# Patient Record
Sex: Male | Born: 1975 | Race: White | Hispanic: No | Marital: Single | Smoking: Never smoker
Health system: Southern US, Community
[De-identification: ages and names within clinical notes are randomized; demographics above are authoritative.]

---

## 2007-05-18 ENCOUNTER — Emergency Department: Payer: Self-pay | Admitting: Emergency Medicine

## 2015-07-18 ENCOUNTER — Emergency Department
Admission: EM | Admit: 2015-07-18 | Discharge: 2015-07-18 | Disposition: A | Discharge status: Home / Self Care | Payer: Self-pay | Attending: Emergency Medicine | Admitting: Emergency Medicine

## 2015-07-18 ENCOUNTER — Encounter: Payer: Self-pay | Admitting: *Deleted

## 2015-07-18 DIAGNOSIS — W25XXXA Contact with sharp glass, initial encounter: Secondary | ICD-10-CM | POA: Insufficient documentation

## 2015-07-18 DIAGNOSIS — S61411A Laceration without foreign body of right hand, initial encounter: Secondary | ICD-10-CM | POA: Insufficient documentation

## 2015-07-18 DIAGNOSIS — Z23 Encounter for immunization: Secondary | ICD-10-CM | POA: Insufficient documentation

## 2015-07-18 DIAGNOSIS — Y9389 Activity, other specified: Secondary | ICD-10-CM | POA: Insufficient documentation

## 2015-07-18 DIAGNOSIS — Y929 Unspecified place or not applicable: Secondary | ICD-10-CM | POA: Insufficient documentation

## 2015-07-18 DIAGNOSIS — Y999 Unspecified external cause status: Secondary | ICD-10-CM | POA: Insufficient documentation

## 2015-07-18 MED ORDER — LIDOCAINE HCL (PF) 1 % IJ SOLN
5.0000 mL | Freq: Once | INTRAMUSCULAR | Status: AC
Start: 2015-07-18 — End: 2015-07-18
  Administered 2015-07-18: 5 mL
  Filled 2015-07-18: qty 5

## 2015-07-18 MED ORDER — TETANUS-DIPHTH-ACELL PERTUSSIS 5-2.5-18.5 LF-MCG/0.5 IM SUSP
0.5000 mL | Freq: Once | INTRAMUSCULAR | Status: AC
  Administered 2015-07-18: 0.5 mL via INTRAMUSCULAR
  Filled 2015-07-18: qty 0.5

## 2015-07-18 NOTE — ED Notes (Signed)
Pt has a laceration to right hand.  Cut hand on drinking glass.  Bleeding controlled.

## 2015-07-18 NOTE — Discharge Instructions (Signed)
Laceration Care, Adult  A laceration is a cut that goes through all layers of the skin. The cut also goes into the tissue that is right under the skin. Some cuts heal on their own. Others need to be closed with stitches (sutures), staples, skin adhesive strips, or wound glue. Taking care of your cut lowers your risk of infection and helps your cut to heal better.  HOW TO TAKE CARE OF YOUR CUT  For stitches or staples:  · Keep the wound clean and dry.  · If you were given a bandage (dressing), you should change it at least one time per day or as told by your doctor. You should also change it if it gets wet or dirty.  · Keep the wound completely dry for the first 24 hours or as told by your doctor. After that time, you may take a shower or a bath. However, make sure that the wound is not soaked in water until after the stitches or staples have been removed.  · Clean the wound one time each day or as told by your doctor:    Wash the wound with soap and water.    Rinse the wound with water until all of the soap comes off.    Pat the wound dry with a clean towel. Do not rub the wound.  · After you clean the wound, put a thin layer of antibiotic ointment on it as told by your doctor. This ointment:    Helps to prevent infection.    Keeps the bandage from sticking to the wound.  · Have your stitches or staples removed as told by your doctor.  If your doctor used skin adhesive strips:   · Keep the wound clean and dry.  · If you were given a bandage, you should change it at least one time per day or as told by your doctor. You should also change it if it gets dirty or wet.  · Do not get the skin adhesive strips wet. You can take a shower or a bath, but be careful to keep the wound dry.  · If the wound gets wet, pat it dry with a clean towel. Do not rub the wound.  · Skin adhesive strips fall off on their own. You can trim the strips as the wound heals. Do not remove any strips that are still stuck to the wound. They will  fall off after a while.  If your doctor used wound glue:  · Try to keep your wound dry, but you may briefly wet it in the shower or bath. Do not soak the wound in water, such as by swimming.  · After you take a shower or a bath, gently pat the wound dry with a clean towel. Do not rub the wound.  · Do not do any activities that will make you really sweaty until the skin glue has fallen off on its own.  · Do not apply liquid, cream, or ointment medicine to your wound while the skin glue is still on.  · If you were given a bandage, you should change it at least one time per day or as told by your doctor. You should also change it if it gets dirty or wet.  · If a bandage is placed over the wound, do not let the tape for the bandage touch the skin glue.  · Do not pick at the glue. The skin glue usually stays on for 5-10 days. Then, it   or when wound glue stays in place and the wound is healed. Make sure to wear a sunscreen of at least 30 SPF.  Take over-the-counter and prescription medicines only as told by your doctor.  If you were given antibiotic medicine or ointment, take or apply it as told by your doctor. Do not stop using the antibiotic even if your wound is getting better.  Do not scratch or pick at the wound.  Keep all follow-up visits as told by your doctor. This is important.  Check your wound every day for signs of infection. Watch for:  Redness, swelling, or pain.  Fluid, blood, or pus.  Raise (elevate) the injured area above the level of your heart while you are sitting or lying down, if possible. GET HELP IF:  You got a tetanus shot and you have any of these problems at the injection site:  Swelling.  Very bad pain.  Redness.  Bleeding.  You have a fever.  A wound that was  closed breaks open.  You notice a bad smell coming from your wound or your bandage.  You notice something coming out of the wound, such as wood or glass.  Medicine does not help your pain.  You have more redness, swelling, or pain at the site of your wound.  You have fluid, blood, or pus coming from your wound.  You notice a change in the color of your skin near your wound.  You need to change the bandage often because fluid, blood, or pus is coming from the wound.  You start to have a new rash.  You start to have numbness around the wound. GET HELP RIGHT AWAY IF:  You have very bad swelling around the wound.  Your pain suddenly gets worse and is very bad.  You notice painful lumps near the wound or on skin that is anywhere on your body.  You have a red streak going away from your wound.  The wound is on your hand or foot and you cannot move a finger or toe like you usually can.  The wound is on your hand or foot and you notice that your fingers or toes look pale or bluish.   This information is not intended to replace advice given to you by your health care provider. Make sure you discuss any questions you have with your health care provider.   Document Released: 09/01/2007 Document Revised: 07/30/2014 Document Reviewed: 03/11/2014 Elsevier Interactive Patient Education [employer]2016 [employer]  Keep the wound clean, dry, and covered. Follow-up with the local urgent care for suture removal.

## 2015-07-18 NOTE — ED Notes (Signed)
Pt in via triage with complaints of laceration to right hand; pt reports washing a wine glass when the stem broke and cut the palm of his hand.  Pt reports being seen at urgent care and being sent here due to depth of laceration.  Right hand dressed in gauze; bleeding controlled at this time.  No signs of immediate distress.

## 2015-07-19 NOTE — ED Provider Notes (Signed)
[facility] Emergency Department Provider Note ____________________________________________  Time seen: 1903  I have reviewed the triage vital signs and the nursing notes.  HISTORY  Chief Complaint  Laceration  HPI Jeffery Kidd is a 40 y.o. male right-hand-dominant male reports to the ED for evaluation of laceration sustained to his right palm at [place] washing a wine glass. He describes the stem of the glass broke in his hand. He initially reported to urgent care but was deferred due to the ED secondary to the depth of the wound. He has a dressing applied to the palm of the hand bleeding is currently controlled. He denies any distal paresthesias, difficulty with flexion of the hand, or any other injury at this time. He reports his tetanus status is not current.He rates his discomfort a 4/10 in triage.  No past medical history on file.  There are no active problems to display for this patient.   No past surgical history on file.  No current outpatient prescriptions on file.  Allergies Review of patient's allergies indicates no known allergies.  No family history on file.  Social History Social History  Substance Use Topics  . Smoking status: Never Smoker   . Smokeless tobacco: None  . Alcohol Use: Yes    Review of Systems  Constitutional: Negative for fever. Cardiovascular: Negative for chest pain. Respiratory: Negative for shortness of breath. Gastrointestinal: Negative for abdominal pain, vomiting and diarrhea. Genitourinary: Negative for dysuria. Musculoskeletal: Negative for back pain.Right palm laceration as above. Skin: Negative for rash. Neurological: Negative for headaches, focal weakness or numbness. ____________________________________________  PHYSICAL EXAM:  VITAL SIGNS: ED Triage Vitals  Enc Vitals Group     BP 07/18/15 1814 136/60 mmHg     Pulse Rate 07/18/15 1814 81     Resp 07/18/15 1814 20     Temp 07/18/15 1814 99.4  F (37.4 C)     Temp Source 07/18/15 1814 Oral     SpO2 07/18/15 1814 99 %     Weight 07/18/15 1814 150 lb (68.04 kg)     Height 07/18/15 1814  (1.778 m)     Head Cir --      Peak Flow --      Pain Score 07/18/15 1815 4     Pain Loc --      Pain Edu? --      Excl. in GC? --    Constitutional: Alert and oriented. Well appearing and in no distress. Head: Normocephalic and atraumatic. Hematological/Lymphatic/Immunological: No cervical lymphadenopathy. Cardiovascular: Normal rate, regular rhythm. Normal distal pulses and normal capillary refill. Respiratory: Normal respiratory effort. No wheezes/rales/rhonchi. Gastrointestinal: Soft and nontender. No distention. Musculoskeletal: Right palm with normal composite fist and flexion range of the fingers. Nontender with normal range of motion in all extremities.  Neurologic: Normal gross sensation is intact. Cranial nerves II through XII grossly intact. Normal gait without ataxia. Normal speech and language. No gross focal neurologic deficits are appreciated. Skin:  Skin is warm, dry and intact. No rash noted. Patient with a 3 cm linear lac across the second and third MCPs of the palm. Psychiatric: Mood and affect are normal. Patient exhibits appropriate insight and judgment. ____________________________________________  PROCEDURES  Tdap 0.5 mg IM  LACERATION REPAIR Performed by: Lissa Hoard Authorized by: Lissa Hoard Consent: Verbal consent obtained. Risks and benefits: risks, benefits and alternatives were discussed Consent given by: patient Patient identity confirmed: provided demographic data Prepped and Draped in normal sterile  fashion Wound explored  Laceration Location: right palm  Laceration Length: 3 cm  No Foreign Bodies seen or palpated  Anesthesia: local infiltration  Local anesthetic: lidocaine 1% w/o epinephrine  Anesthetic total: 5 ml  Irrigation method: syringe Amount of  cleaning: standard  Skin closure: 4-0 nylon  Number of sutures: 8  Technique: simple interrupted  Patient tolerance: Patient tolerated the procedure well with no immediate complications. ____________________________________________  INITIAL IMPRESSION / ASSESSMENT AND PLAN / ED COURSE  Patient with a laceration to the palm of the right hand just over the first and second MCPs. There is no neuromuscular deficit on exam and no flexor tendon injury. Wounds are repaired with sutures and the patient is discharged with wound care management instructions be he should follow with his primary care provider with the local urgent cares for suture removal in 10-12 days.  ____________________________________________  FINAL CLINICAL IMPRESSION(S) / ED DIAGNOSES  Final diagnoses:  Hand laceration, right, initial encounter      Lissa HoardJenise V Bacon Janica Eldred, PA-C 07/19/15 0136  Phineas SemenGraydon Goodman, MD 07/19/15 1538

## 2017-02-08 ENCOUNTER — Emergency Department
Admission: EM | Admit: 2017-02-08 | Discharge: 2017-02-08 | Disposition: A | Discharge status: Home / Self Care | Payer: Self-pay | Attending: Emergency Medicine | Admitting: Emergency Medicine

## 2017-02-08 ENCOUNTER — Encounter: Payer: Self-pay | Admitting: *Deleted

## 2017-02-08 ENCOUNTER — Other Ambulatory Visit: Payer: Self-pay

## 2017-02-08 DIAGNOSIS — T148XXA Other injury of unspecified body region, initial encounter: Secondary | ICD-10-CM

## 2017-02-08 DIAGNOSIS — L03115 Cellulitis of right lower limb: Secondary | ICD-10-CM | POA: Insufficient documentation

## 2017-02-08 DIAGNOSIS — F424 Excoriation (skin-picking) disorder: Secondary | ICD-10-CM | POA: Insufficient documentation

## 2017-02-08 DIAGNOSIS — Z23 Encounter for immunization: Secondary | ICD-10-CM | POA: Insufficient documentation

## 2017-02-08 DIAGNOSIS — Z978 Presence of other specified devices: Secondary | ICD-10-CM | POA: Insufficient documentation

## 2017-02-08 DIAGNOSIS — M7918 Myalgia, other site: Secondary | ICD-10-CM | POA: Insufficient documentation

## 2017-02-08 DIAGNOSIS — R52 Pain, unspecified: Secondary | ICD-10-CM

## 2017-02-08 LAB — COMPREHENSIVE METABOLIC PANEL
ALT: 66 U/L — ABNORMAL HIGH (ref 17–63)
ANION GAP: 10 (ref 5–15)
AST: 38 U/L (ref 15–41)
Albumin: 4.1 g/dL (ref 3.5–5.0)
Alkaline Phosphatase: 185 U/L — ABNORMAL HIGH (ref 38–126)
BUN: 11 mg/dL (ref 6–20)
CHLORIDE: 101 mmol/L (ref 101–111)
CO2: 26 mmol/L (ref 22–32)
CREATININE: 0.76 mg/dL (ref 0.61–1.24)
Calcium: 8.8 mg/dL — ABNORMAL LOW (ref 8.9–10.3)
GFR calc Af Amer: >60 mL/min (ref >60)
Glucose, Bld: 111 mg/dL — ABNORMAL HIGH (ref 65–99)
POTASSIUM: 3.5 mmol/L (ref 3.5–5.1)
Sodium: 137 mmol/L (ref 135–145)
Total Bilirubin: 0.8 mg/dL (ref 0.3–1.2)
Total Protein: 8.2 g/dL — ABNORMAL HIGH (ref 6.5–8.1)

## 2017-02-08 LAB — CBC WITH DIFFERENTIAL/PLATELET
Basophils Absolute: 0 10*3/uL (ref 0–0.1)
Basophils Relative: 0 %
EOS ABS: 0.2 10*3/uL (ref 0–0.7)
Eosinophils Relative: 2 %
HCT: 38.2 % — ABNORMAL LOW (ref 40.0–52.0)
Hemoglobin: 13.1 g/dL (ref 13.0–18.0)
LYMPHS ABS: 1 10*3/uL (ref 1.0–3.6)
LYMPHS PCT: 11 %
MCH: 30.1 pg (ref 26.0–34.0)
MCHC: 34.2 g/dL (ref 32.0–36.0)
MCV: 88 fL (ref 80.0–100.0)
Monocytes Absolute: 0.6 10*3/uL (ref 0.2–1.0)
Monocytes Relative: 6 %
NEUTROS PCT: 81 %
Neutro Abs: 7.9 10*3/uL — ABNORMAL HIGH (ref 1.4–6.5)
Platelets: 305 10*3/uL (ref 150–440)
RBC: 4.35 MIL/uL — ABNORMAL LOW (ref 4.40–5.90)
RDW: 12.6 % (ref 11.5–14.5)
WBC: 9.8 10*3/uL (ref 3.8–10.6)

## 2017-02-08 MED ORDER — TETANUS-DIPHTH-ACELL PERTUSSIS 5-2.5-18.5 LF-MCG/0.5 IM SUSP
0.5000 mL | Freq: Once | INTRAMUSCULAR | Status: AC
  Administered 2017-02-08: 0.5 mL via INTRAMUSCULAR
  Filled 2017-02-08: qty 0.5

## 2017-02-08 MED ORDER — TRIPLE ANTIBIOTIC 5-400-5000 EX OINT: TOPICAL_OINTMENT | Freq: Three times a day (TID) | CUTANEOUS | 0 refills | Status: AC

## 2017-02-08 MED ORDER — IBUPROFEN 800 MG PO TABS
800.0000 mg | ORAL_TABLET | Freq: Once | ORAL | Status: AC
  Administered 2017-02-08: 800 mg via ORAL
  Filled 2017-02-08: qty 1

## 2017-02-08 MED ORDER — BACITRACIN ZINC 500 UNIT/GM EX OINT
TOPICAL_OINTMENT | Freq: Once | CUTANEOUS | Status: AC
  Administered 2017-02-08: 1 via TOPICAL
  Filled 2017-02-08: qty 0.9

## 2017-02-08 MED ORDER — SULFAMETHOXAZOLE-TRIMETHOPRIM 800-160 MG PO TABS: 1.0000 | ORAL_TABLET | Freq: Two times a day (BID) | ORAL | 0 refills | Status: DC

## 2017-02-08 MED ORDER — IBUPROFEN 800 MG PO TABS: 800.0000 mg | ORAL_TABLET | Freq: Three times a day (TID) | ORAL | 0 refills | Status: DC | PRN

## 2017-02-08 MED ORDER — SULFAMETHOXAZOLE-TRIMETHOPRIM 800-160 MG PO TABS
1.0000 | ORAL_TABLET | Freq: Once | ORAL | Status: AC
  Administered 2017-02-08: 1 via ORAL
  Filled 2017-02-08: qty 1

## 2017-02-08 NOTE — ED Notes (Signed)
Rash with skin discoloration to right lower extremity - near his ankle  - scratches present also  Pt reports that he has had it for months and he has tried everything but it will not go away

## 2017-02-08 NOTE — ED Provider Notes (Signed)
[facility] Emergency Department Provider Note  ____________________________________________  Time seen: Approximately 3:40 PM  I have reviewed the triage vital signs and the nursing notes.   HISTORY  Chief Complaint Rash    HPI Jeffery Kidd is a 41 y.o. male with a history of right tibial hardware placement for fracture remotely presenting with right lower extremity rash, and separately with upper body aches.  The patient reports that for the past month he has had excoriations, scabbing to the medial right lower extremity.  "May be I scratched it while I was sleeping."  He denies any new injury, systemic symptoms including fever, nausea or vomiting.  Over the past 2-3 days, the patient also reports that "every muscle in my body from my waist upwards is sore."  He denies any associated cough or cold symptoms, nausea vomiting or diarrhea, or trauma or new exercise regimen.  He has not tried anything for his pain.  History reviewed. No pertinent past medical history.  There are no active problems to display for this patient.   History reviewed. No pertinent surgical history.    Allergies Patient has no known allergies.  History reviewed. No pertinent family history.  Social History Social History   Tobacco Use  . Smoking status: Never Smoker  . Smokeless tobacco: Never Used  Substance Use Topics  . Alcohol use: Yes  . Drug use: Not on file    Review of Systems Constitutional: No fever/chills.  No lightheadedness or syncope. Eyes: No visual changes. ENT: No sore throat. No congestion or rhinorrhea. Cardiovascular: Denies chest pain. Denies palpitations. Respiratory: Denies shortness of breath.  No cough. Gastrointestinal: No abdominal pain.  No nausea, no vomiting.  No diarrhea.  No constipation. Genitourinary: Negative for dysuria. Musculoskeletal: Negative for back pain.  Positive for generalized body aches in the upper body. Skin:  Positive for rash. Neurological: Negative for headaches. No focal numbness, tingling or weakness.     ____________________________________________   PHYSICAL EXAM:  VITAL SIGNS: ED Triage Vitals  Enc Vitals Group     BP 02/08/17 1153 134/60     Pulse Rate 02/08/17 1153 85     Resp 02/08/17 1153 16     Temp 02/08/17 1153 99.9 F (37.7 C)     Temp Source 02/08/17 1153 Oral     SpO2 02/08/17 1153 99 %     Weight 02/08/17 1153 155 lb (70.3 kg)     Height 02/08/17 1153 5\' 10"  (1.778 m)     Head Circumference --      Peak Flow --      Pain Score 02/08/17 1452 8     Pain Loc --      Pain Edu? --      Excl. in GC? --     Constitutional: Alert and oriented. Well appearing and in no acute distress. Answers questions appropriately.  The patient is able to move about without any difficulty and ambulates without any problem. Eyes: Conjunctivae are normal.  EOMI. No scleral icterus. Head: Atraumatic. Nose: No congestion/rhinnorhea. Mouth/Throat: Mucous membranes are moist.  Neck: No stridor.  Supple.  No JVD.  No meningismus. Cardiovascular: Normal rate, regular rhythm. No murmurs, rubs or gallops.  Respiratory: Normal respiratory effort.  No accessory muscle use or retractions. Lungs CTAB.  No wheezes, rales or ronchi. Gastrointestinal: Soft, nontender and nondistended.  No guarding or rebound.  No peritoneal signs. Musculoskeletal: No lower extremity swelling.  The patient has full range of motion of the  bilateral wrists, elbows and shoulders without pain and no evidence of effusion.  He has no evidence of muscle contractions or any overlying skin abnormalities in the chest, back, bilateral upper extremities where he states he is experiencing severe body aches. Neurologic:  A&Ox3.  Speech is clear.  Face and smile are symmetric.  EOMI.  Moves all extremities well. Skin:  Skin is warm, dry.  The patient has an excoriated superficial abrasions of the right medial lower extremity most  prominent around the medial malleolus and moving upwards towards the knee.  The findings are consistent with scratches, and most are scabbed over.  There is some minimal erythema without warmth surrounding these areas.  There is no fluctuance or significant swelling.  The patient has normal DP and PT pulses on the right. Psychiatric: Mood and affect are normal. Speech and behavior are normal.  Normal judgement.  ____________________________________________   LABS (all labs ordered are listed, but only abnormal results are displayed)  Labs Reviewed  COMPREHENSIVE METABOLIC PANEL - Abnormal; Notable for the following components:      Result Value   Glucose, Bld 111 (*)    Calcium 8.8 (*)    Total Protein 8.2 (*)    ALT 66 (*)    Alkaline Phosphatase 185 (*)    All other components within normal limits  CBC WITH DIFFERENTIAL/PLATELET - Abnormal; Notable for the following components:   RBC 4.35 (*)    HCT 38.2 (*)    Neutro Abs 7.9 (*)    All other components within normal limits   ____________________________________________  EKG  Not indicated ____________________________________________  RADIOLOGY  No results found.  ____________________________________________   PROCEDURES  Procedure(s) performed: None  Procedures  Critical Care performed: No ____________________________________________   INITIAL IMPRESSION / ASSESSMENT AND PLAN / ED COURSE  Pertinent labs & imaging results that were available during my care of the patient were reviewed by me and considered in my medical decision making (see chart for details).  [age] y.o. male with prior right lower extremity hardware for fracture presenting with overlying rash that is most consistent with self scratching.  However, there is some mild erythema, so we will treat the patient with 10 days of antibiotics for overlying cellulitis.  There is no evidence of septic joint.  I have encouraged him to use a triple antibiotic  cream topically 3 times daily, and we will apply the first dose for him here.  We will also update his Tdap.  In terms of his generalized body aches, he has no associated symptoms so influenza is unlikely.  No recent strain or trauma, so rhabdomyolysis is very unlikely; the patient's creatinine is normal today and he does not look particularly dehydrated.  There is no associated weakness.  I have recommended that the patient take Motrin and drink plenty of fluid, and get reevaluated by his primary care physician when he goes back for wound check in 3-5 days.  At this time, the patient is safe for discharge.  He understands return precautions as well as follow-up instructions.  ____________________________________________  FINAL CLINICAL IMPRESSION(S) / ED DIAGNOSES  Final diagnoses:  Cellulitis of right lower extremity  Scratches self  Body aches         NEW MEDICATIONS STARTED DURING THIS VISIT:  This SmartLink is deprecated. Use AVSMEDLIST instead to display the medication list for a patient.    Rockne MenghiniNorman, Anne-Caroline, MD 02/08/17 1550

## 2017-02-08 NOTE — Discharge Instructions (Signed)
Please take all precautions not to scratch her leg anymore.  Take the entire course of antibiotics, even if your leg is feeling better.  Continue to apply Neosporin or any triple antibiotic cream and a thick coat 3 times daily until your wound has completely healed.  Drink plenty of fluids stay well-hydrated.  Return to the emergency department if you develop severe pain, fever, increasing redness, swelling or pus drainage from your wound, nausea or vomiting, or any other symptoms concerning to you.
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
## 2017-02-08 NOTE — ED Notes (Signed)
Right leg dressed with gauze and bacitracin ointment.  Pt alert. D/c in st to pt.

## 2017-02-23 ENCOUNTER — Other Ambulatory Visit: Payer: Self-pay

## 2017-02-23 ENCOUNTER — Encounter: Payer: Self-pay | Admitting: Emergency Medicine

## 2017-02-23 ENCOUNTER — Emergency Department
Admission: EM | Admit: 2017-02-23 | Discharge: 2017-02-23 | Disposition: A | Discharge status: Home / Self Care | Payer: Self-pay | Attending: Emergency Medicine | Admitting: Emergency Medicine

## 2017-02-23 ENCOUNTER — Emergency Department: Payer: Self-pay

## 2017-02-23 DIAGNOSIS — T370X5A Adverse effect of sulfonamides, initial encounter: Secondary | ICD-10-CM | POA: Insufficient documentation

## 2017-02-23 DIAGNOSIS — L270 Generalized skin eruption due to drugs and medicaments taken internally: Secondary | ICD-10-CM | POA: Insufficient documentation

## 2017-02-23 DIAGNOSIS — R21 Rash and other nonspecific skin eruption: Secondary | ICD-10-CM | POA: Insufficient documentation

## 2017-02-23 DIAGNOSIS — A938 Other specified arthropod-borne viral fevers: Secondary | ICD-10-CM | POA: Insufficient documentation

## 2017-02-23 LAB — URINALYSIS, COMPLETE (UACMP) WITH MICROSCOPIC
BILIRUBIN URINE: NEGATIVE
Bacteria, UA: NONE SEEN
Glucose, UA: NEGATIVE mg/dL
Ketones, ur: NEGATIVE mg/dL
Leukocytes, UA: NEGATIVE
NITRITE: NEGATIVE
PH: 5 (ref 5.0–8.0)
Protein, ur: NEGATIVE mg/dL
SPECIFIC GRAVITY, URINE: 1.009 (ref 1.005–1.030)

## 2017-02-23 LAB — COMPREHENSIVE METABOLIC PANEL
ALBUMIN: 4.1 g/dL (ref 3.5–5.0)
ALT: 42 U/L (ref 17–63)
ANION GAP: 12 (ref 5–15)
AST: 34 U/L (ref 15–41)
Alkaline Phosphatase: 149 U/L — ABNORMAL HIGH (ref 38–126)
BUN: 11 mg/dL (ref 6–20)
CHLORIDE: 98 mmol/L — AB (ref 101–111)
CO2: 27 mmol/L (ref 22–32)
Calcium: 8.9 mg/dL (ref 8.9–10.3)
Creatinine, Ser: 0.64 mg/dL (ref 0.61–1.24)
GFR calc Af Amer: >60 mL/min (ref >60)
GFR calc non Af Amer: >60 mL/min (ref >60)
GLUCOSE: 99 mg/dL (ref 65–99)
POTASSIUM: 3.8 mmol/L (ref 3.5–5.1)
SODIUM: 137 mmol/L (ref 135–145)
TOTAL PROTEIN: 7.8 g/dL (ref 6.5–8.1)
Total Bilirubin: 0.8 mg/dL (ref 0.3–1.2)

## 2017-02-23 LAB — CBC WITH DIFFERENTIAL/PLATELET
BASOS ABS: 0 10*3/uL (ref 0–0.1)
BASOS PCT: 0 %
EOS ABS: 0.6 10*3/uL (ref 0–0.7)
EOS PCT: 6 %
HCT: 39 % — ABNORMAL LOW (ref 40.0–52.0)
Hemoglobin: 13.4 g/dL (ref 13.0–18.0)
Lymphocytes Relative: 11 %
Lymphs Abs: 1.2 10*3/uL (ref 1.0–3.6)
MCH: 29.8 pg (ref 26.0–34.0)
MCHC: 34.4 g/dL (ref 32.0–36.0)
MCV: 86.7 fL (ref 80.0–100.0)
MONO ABS: 0.7 10*3/uL (ref 0.2–1.0)
MONOS PCT: 7 %
NEUTROS ABS: 7.7 10*3/uL — AB (ref 1.4–6.5)
Neutrophils Relative %: 76 %
PLATELETS: 328 10*3/uL (ref 150–440)
RBC: 4.5 MIL/uL (ref 4.40–5.90)
RDW: 12.6 % (ref 11.5–14.5)
WBC: 10.1 10*3/uL (ref 3.8–10.6)

## 2017-02-23 LAB — CHLAMYDIA/NGC RT PCR (ARMC ONLY)
CHLAMYDIA TR: NOT DETECTED
N GONORRHOEAE: NOT DETECTED

## 2017-02-23 LAB — POCT RAPID STREP A: Streptococcus, Group A Screen (Direct): NEGATIVE

## 2017-02-23 LAB — CK: Total CK: 44 U/L — ABNORMAL LOW (ref 49–397)

## 2017-02-23 MED ORDER — DOXYCYCLINE HYCLATE 100 MG PO CAPS: 100.0000 mg | ORAL_CAPSULE | Freq: Two times a day (BID) | ORAL | 0 refills | Status: AC

## 2017-02-23 MED ORDER — SODIUM CHLORIDE 0.9 % IV BOLUS (SEPSIS)
1000.0000 mL | Freq: Once | INTRAVENOUS | Status: AC
  Administered 2017-02-23: 1000 mL via INTRAVENOUS

## 2017-02-23 NOTE — Discharge Instructions (Signed)
Or up with your regular doctor or the acute care if you're not better in 3 days, list sulfa medications in your allergies, to not take your sulfa medication that was prescribed other day, due to the muscular pain and petechial type rash we have concerns of view having a tick born illness, you need to take the doxycycline until it is finished, turned to the emergency department if you're worsening

## 2017-02-23 NOTE — ED Provider Notes (Signed)
[facility] Emergency Department Provider Note  ____________________________________________   First MD Initiated Contact with Patient 02/23/17 1455     (approximate)  I have reviewed the triage vital signs and the nursing notes.   HISTORY  Chief Complaint Rash and Generalized Body Aches    HPI Jeffery Kidd is a 41 y.o. male complains of a rash all over his body, body aches states every muscle in his body hurts, he was seen here last week for the same rash on the lower leg, was given a prescription for Septra, states he broke out all over in these red bumps, states he still hurts all over, doesn't know if he had a fever but he felt warm, denies any injury, denies burning with urination, has had unprotected sex, as any discharge from penis, patient states he feels a little confused   History reviewed. No pertinent past medical history.  There are no active problems to display for this patient.   History reviewed. No pertinent surgical history.  Prior to Admission medications   Medication Sig Start Date End Date Taking? Authorizing Provider  doxycycline (VIBRAMYCIN) 100 MG capsule Take 1 capsule (100 mg total) by mouth 2 (two) times daily. 02/23/17   Zemirah Krasinski, Roselyn BeringSusan W, PA-C  neomycin-bacitracin-polymyxin (NEOSPORIN) [phone] ointment Apply 3 (three) times daily topically. 02/08/17   Rockne MenghiniNorman, Anne-Caroline, MD    Allergies Sulfa antibiotics  No family history on file.  Social History Social History   Tobacco Use  . Smoking status: Never Smoker  . Smokeless tobacco: Never Used  Substance Use Topics  . Alcohol use: Yes  . Drug use: Not on file    Review of Systems  Constitutional: No fever/chills Eyes: No visual changes. ENT: No sore throat. Respiratory: Denies cough Genitourinary: Negative for dysuria. Musculoskeletal: Positive muscle aches Skin: Positive for rash.    ____________________________________________   PHYSICAL  EXAM:  VITAL SIGNS: ED Triage Vitals  Enc Vitals Group     BP 02/23/17 1451 127/77     Pulse Rate 02/23/17 1451 (!) 104     Resp 02/23/17 1451 18     Temp 02/23/17 1451 99 F (37.2 C)     Temp Source 02/23/17 1451 Oral     SpO2 02/23/17 1451 99 %     Weight 02/23/17 1452 155 lb (70.3 kg)     Height 02/23/17 1452 5\' 10"  (1.778 m)     Head Circumference --      Peak Flow --      Pain Score --      Pain Loc --      Pain Edu? --      Excl. in GC? --     Constitutional: Alert and oriented. Well appearing and in no acute distress. Eyes: Conjunctivae are normal.  Head: Atraumatic. Nose: No congestion/rhinnorhea. Mouth/Throat: Mucous membranes are moist.  Throat is red Cardiovascular: Normal rate, regular rhythm. Respiratory: Normal respiratory effort.  No retractions GU: deferred Musculoskeletal: FROM all extremities, warm and well perfused, right lower leg is tender near the ankle, there is a large pinkish red rash over the ankle Neurologic:  Normal speech and language.  Skin:  Skin is warm, dry and intact. There is a large pinkish red area about 6" x 4" over the right ankle, there is a disseminated scab-like rash over his flanks, on his legs, on his arms, no pustules or drainage noted, neurovascular is intact Psychiatric: Mood and affect are normal. Speech and behavior are normal.  ____________________________________________  LABS (all labs ordered are listed, but only abnormal results are displayed)  Labs Reviewed  CBC WITH DIFFERENTIAL/PLATELET - Abnormal; Notable for the following components:      Result Value   HCT 39.0 (*)    Neutro Abs 7.7 (*)    All other components within normal limits  COMPREHENSIVE METABOLIC PANEL - Abnormal; Notable for the following components:   Chloride 98 (*)    Alkaline Phosphatase 149 (*)    All other components within normal limits  CK - Abnormal; Notable for the following components:   Total CK 44 (*)    All other components within  normal limits  URINALYSIS, COMPLETE (UACMP) WITH MICROSCOPIC - Abnormal; Notable for the following components:   Color, Urine YELLOW (*)    APPearance CLEAR (*)    Hgb urine dipstick MODERATE (*)    Squamous Epithelial / LPF 0-5 (*)    All other components within normal limits  CHLAMYDIA/NGC RT PCR ([facility] ONLY)  POCT RAPID STREP A   ____________________________________________   ____________________________________________  RADIOLOGY  X-ray of the ankle was negative for any soft tissue infection or osteomyelitis  ____________________________________________   PROCEDURES  Procedure(s) performed: No      ____________________________________________   INITIAL IMPRESSION / ASSESSMENT AND PLAN / ED COURSE  Pertinent labs & imaging results that were available during my care of the patient were reviewed by me and considered in my medical decision making (see chart for details).  Patient is a 41 year old male that comes in planning of a rash and muscle aches, he was seen for the same last week was given Septra and ibuprofen, he states his rash is spread all over his body, doesn't seem to be very itchy, he admits to having unprotected sex, his throat is red, quick strep was negative, labs are ordered, x-ray of the right lower leg is ordered  ----------------------------------------- 6:47 PM on 02/23/2017 -----------------------------------------  Patient's labs are basically normal, GC chlamydia is negative, patient was given 2 bags of IV fluids while he was here, discussed his symptoms with Dr. Scotty CourtStafford and he reviewed the labs, we have concerns of tickborne illness due to the muscle pain associated with a rash, patient was given a prescription for doxycycline 100 mg twice a day for 21 days, he was instructed to finish the medication, he is to return to the emergency department if he is worsening, he is to drink plenty fluids, he was instructed to put sulfa on his allergy  list due to the concern of a sulfa allergy, he is to take Benadryl for itching if needed     ____________________________________________   FINAL CLINICAL IMPRESSION(S) / ED DIAGNOSES  Final diagnoses:  Rash and nonspecific skin eruption  Tick-borne fever  Allergic drug rash due to sulfonamide      NEW MEDICATIONS STARTED DURING THIS VISIT:  This SmartLink is deprecated. Use AVSMEDLIST instead to display the medication list for a patient.   Note:  This document was prepared using Dragon voice recognition software and may include unintentional dictation errors.    Faythe GheeFisher, Arlon Bleier W, PA-C 02/23/17 [place] [id number]    [place], [place], MD 02/23/17 [phone]
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
## 2020-05-24 ENCOUNTER — Other Ambulatory Visit: Payer: Self-pay

## 2020-05-24 ENCOUNTER — Encounter: Payer: Self-pay | Admitting: *Deleted

## 2020-05-24 ENCOUNTER — Emergency Department: Payer: Self-pay

## 2020-05-24 DIAGNOSIS — Y92410 Unspecified street and highway as the place of occurrence of the external cause: Secondary | ICD-10-CM | POA: Insufficient documentation

## 2020-05-24 DIAGNOSIS — S42202A Unspecified fracture of upper end of left humerus, initial encounter for closed fracture: Secondary | ICD-10-CM | POA: Insufficient documentation

## 2020-05-24 DIAGNOSIS — Y9301 Activity, walking, marching and hiking: Secondary | ICD-10-CM | POA: Insufficient documentation
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
## 2020-05-25 ENCOUNTER — Emergency Department
Admission: EM | Admit: 2020-05-25 | Discharge: 2020-05-25 | Disposition: A | Discharge status: Home / Self Care | Payer: Self-pay | Attending: Emergency Medicine | Admitting: Emergency Medicine

## 2020-05-25 ENCOUNTER — Telehealth: Payer: Self-pay | Admitting: Emergency Medicine

## 2020-05-25 DIAGNOSIS — S42202A Unspecified fracture of upper end of left humerus, initial encounter for closed fracture: Secondary | ICD-10-CM

## 2020-05-25 MED ORDER — HYDROMORPHONE HCL 1 MG/ML IJ SOLN
1.0000 mg | INTRAMUSCULAR | Status: DC
Start: 2020-05-25 — End: 2020-05-25

## 2020-05-25 MED ORDER — DOCUSATE SODIUM 100 MG PO CAPS: ORAL_CAPSULE | ORAL | 0 refills | Status: AC

## 2020-05-25 MED ORDER — OXYCODONE-ACETAMINOPHEN 5-325 MG PO TABS: 2.0000 | ORAL_TABLET | Freq: Four times a day (QID) | ORAL | 0 refills | Status: AC | PRN

## 2020-05-25 MED ORDER — HYDROCODONE-ACETAMINOPHEN 5-325 MG PO TABS
2.0000 | ORAL_TABLET | Freq: Once | ORAL | Status: AC
  Administered 2020-05-25: 2 via ORAL
  Filled 2020-05-25: qty 2

## 2020-05-25 MED ORDER — HYDROMORPHONE HCL 1 MG/ML IJ SOLN
1.0000 mg | INTRAMUSCULAR | Status: AC
  Administered 2020-05-25: 1 mg via INTRAMUSCULAR
  Filled 2020-05-25: qty 1

## 2020-05-25 NOTE — Telephone Encounter (Signed)
I was called by [facility] pharmacy in [place] that patient's Colace prescription went through, but not his Percocet.  I reviewed patient's chart and E-prescribed prescription for Percocet per original physicians plan of care.
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
## 2020-05-25 NOTE — Discharge Instructions (Addendum)
As we discussed, you have a fracture to the upper part of your upper arm.  Use the sling as much as possible and do not bear weight with your left arm.  Try to minimize the use and movement of your arm.  Call the office of Dr. Allena Katz on Monday to schedule a follow-up appointment in about a week.  Take over-the-counter ibuprofen and Tylenol as needed and according to label instructions for pain control.  You may also want to use ice packs on your upper arm to help with pain and swelling. Take Percocet as prescribed for severe pain. Do not drink alcohol, drive or participate in any other potentially dangerous activities while taking this medication as it may make you sleepy. Do not take this medication with any other sedating medications, either prescription or over-the-counter. If you were prescribed Percocet or Vicodin, do not take these with acetaminophen (Tylenol) as it is already contained within these medications.   This medication is an opiate (or narcotic) pain medication and can be habit forming.  Use it as little as possible to achieve adequate pain control.  Do not use or use it with extreme caution if you have a history of opiate abuse or dependence.  If you are on a pain contract with your primary care doctor or a pain specialist, be sure to let them know you were prescribed this medication today from the [facility] Emergency Department.  This medication is intended for your use only - do not give any to anyone else and keep it in a secure place where nobody else, especially children, have access to it.  It will also cause or worsen constipation, so you may want to consider taking an over-the-counter stool softener while you are taking this medication.    Return to the emergency department if you develop new or worsening symptoms that concern you.
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
